# Patient Record
Sex: Male | Born: 1950 | Race: White | Hispanic: No | Smoking: Never smoker
Health system: Southern US, Community
[De-identification: ages and names within clinical notes are randomized; demographics above are authoritative.]

## PROBLEM LIST (undated history)

## (undated) DIAGNOSIS — R945 Abnormal results of liver function studies: Secondary | ICD-10-CM

## (undated) DIAGNOSIS — R0600 Dyspnea, unspecified: Secondary | ICD-10-CM

## (undated) DIAGNOSIS — R079 Chest pain, unspecified: Secondary | ICD-10-CM

## (undated) DIAGNOSIS — R0989 Other specified symptoms and signs involving the circulatory and respiratory systems: Secondary | ICD-10-CM

## (undated) DIAGNOSIS — R7989 Other specified abnormal findings of blood chemistry: Secondary | ICD-10-CM

## (undated) DIAGNOSIS — E119 Type 2 diabetes mellitus without complications: Secondary | ICD-10-CM

## (undated) DIAGNOSIS — I10 Essential (primary) hypertension: Secondary | ICD-10-CM

## (undated) DIAGNOSIS — E785 Hyperlipidemia, unspecified: Secondary | ICD-10-CM

## (undated) DIAGNOSIS — R0609 Other forms of dyspnea: Secondary | ICD-10-CM

## (undated) DIAGNOSIS — K219 Gastro-esophageal reflux disease without esophagitis: Secondary | ICD-10-CM

## (undated) HISTORY — DX: Other specified symptoms and signs involving the circulatory and respiratory systems: R09.89

## (undated) HISTORY — DX: Hyperlipidemia, unspecified: E78.5

## (undated) HISTORY — DX: Dyspnea, unspecified: R06.00

## (undated) HISTORY — DX: Chest pain, unspecified: R07.9

## (undated) HISTORY — DX: Other specified abnormal findings of blood chemistry: R79.89

## (undated) HISTORY — DX: Abnormal results of liver function studies: R94.5

## (undated) HISTORY — DX: Gastro-esophageal reflux disease without esophagitis: K21.9

## (undated) HISTORY — PX: REPLACEMENT TOTAL KNEE: SUR1224

## (undated) HISTORY — DX: Type 2 diabetes mellitus without complications: E11.9

## (undated) HISTORY — DX: Essential (primary) hypertension: I10

## (undated) HISTORY — DX: Other forms of dyspnea: R06.09

---

## 2013-08-19 ENCOUNTER — Encounter: Payer: Self-pay | Admitting: Cardiovascular Disease

## 2013-08-20 ENCOUNTER — Other Ambulatory Visit: Payer: Self-pay | Admitting: *Deleted

## 2013-08-20 ENCOUNTER — Encounter (HOSPITAL_COMMUNITY): Payer: Self-pay | Admitting: Pharmacy Technician

## 2013-08-20 ENCOUNTER — Encounter: Payer: Self-pay | Admitting: *Deleted

## 2013-08-20 DIAGNOSIS — Z01818 Encounter for other preprocedural examination: Secondary | ICD-10-CM

## 2013-08-21 ENCOUNTER — Encounter: Payer: Self-pay | Admitting: Cardiovascular Disease

## 2013-08-27 ENCOUNTER — Encounter (HOSPITAL_COMMUNITY): Payer: Self-pay | Admitting: *Deleted

## 2013-08-27 ENCOUNTER — Ambulatory Visit (HOSPITAL_COMMUNITY)
Admission: RE | Admit: 2013-08-27 | Discharge: 2013-08-27 | Disposition: A | Discharge status: Home / Self Care | Payer: Medicare Other | Source: Ambulatory Visit | Attending: Cardiovascular Disease | Admitting: Cardiovascular Disease

## 2013-08-27 ENCOUNTER — Encounter (HOSPITAL_COMMUNITY)
Admission: RE | Disposition: A | Discharge status: Home / Self Care | Payer: Self-pay | Source: Ambulatory Visit | Attending: Cardiovascular Disease

## 2013-08-27 DIAGNOSIS — R7989 Other specified abnormal findings of blood chemistry: Secondary | ICD-10-CM | POA: Insufficient documentation

## 2013-08-27 DIAGNOSIS — Z01818 Encounter for other preprocedural examination: Secondary | ICD-10-CM

## 2013-08-27 DIAGNOSIS — R0989 Other specified symptoms and signs involving the circulatory and respiratory systems: Secondary | ICD-10-CM | POA: Insufficient documentation

## 2013-08-27 DIAGNOSIS — I491 Atrial premature depolarization: Secondary | ICD-10-CM | POA: Insufficient documentation

## 2013-08-27 DIAGNOSIS — Z7982 Long term (current) use of aspirin: Secondary | ICD-10-CM | POA: Insufficient documentation

## 2013-08-27 DIAGNOSIS — E669 Obesity, unspecified: Secondary | ICD-10-CM | POA: Insufficient documentation

## 2013-08-27 DIAGNOSIS — R0609 Other forms of dyspnea: Secondary | ICD-10-CM | POA: Insufficient documentation

## 2013-08-27 DIAGNOSIS — E66811 Obesity, class 1: Secondary | ICD-10-CM

## 2013-08-27 DIAGNOSIS — R9439 Abnormal result of other cardiovascular function study: Secondary | ICD-10-CM

## 2013-08-27 DIAGNOSIS — E785 Hyperlipidemia, unspecified: Secondary | ICD-10-CM

## 2013-08-27 DIAGNOSIS — E119 Type 2 diabetes mellitus without complications: Secondary | ICD-10-CM

## 2013-08-27 DIAGNOSIS — K219 Gastro-esophageal reflux disease without esophagitis: Secondary | ICD-10-CM | POA: Insufficient documentation

## 2013-08-27 DIAGNOSIS — I10 Essential (primary) hypertension: Secondary | ICD-10-CM

## 2013-08-27 HISTORY — PX: LEFT HEART CATHETERIZATION WITH CORONARY ANGIOGRAM: SHX5451

## 2013-08-27 LAB — BASIC METABOLIC PANEL
BUN: 23 mg/dL (ref 6–23)
Chloride: 103 mEq/L (ref 96–112)
GFR calc Af Amer: >90 mL/min (ref >90)
GFR calc non Af Amer: >90 mL/min (ref >90)
Potassium: 3.4 mEq/L — ABNORMAL LOW (ref 3.5–5.1)
Sodium: 141 mEq/L (ref 135–145)

## 2013-08-27 LAB — CBC
HCT: 47 % (ref 39.0–52.0)
Hemoglobin: 16.6 g/dL (ref 13.0–17.0)
MCHC: 35.3 g/dL (ref 30.0–36.0)
MCV: 92.3 fL (ref 78.0–100.0)
RBC: 5.09 MIL/uL (ref 4.22–5.81)
WBC: 9.9 10*3/uL (ref 4.0–10.5)

## 2013-08-27 LAB — PROTIME-INR
INR: 1.07 (ref 0.00–1.49)
Prothrombin Time: 13.7 seconds (ref 11.6–15.2)

## 2013-08-27 LAB — GLUCOSE, CAPILLARY: Glucose-Capillary: 88 mg/dL (ref 70–99)

## 2013-08-27 SURGERY — LEFT HEART CATHETERIZATION WITH CORONARY ANGIOGRAM
Anesthesia: LOCAL

## 2013-08-27 MED ORDER — SODIUM CHLORIDE 0.9 % IV SOLN: INTRAVENOUS | Status: AC

## 2013-08-27 MED ORDER — ONDANSETRON HCL 4 MG/2ML IJ SOLN: 4.0000 mg | Freq: Four times a day (QID) | INTRAMUSCULAR | Status: DC | PRN

## 2013-08-27 MED ORDER — ACETAMINOPHEN 325 MG PO TABS: 650.0000 mg | ORAL_TABLET | ORAL | Status: DC | PRN

## 2013-08-27 MED ORDER — DIAZEPAM 5 MG PO TABS
ORAL_TABLET | ORAL | Status: AC
  Filled 2013-08-27: qty 1

## 2013-08-27 MED ORDER — SODIUM CHLORIDE 0.9 % IV SOLN
INTRAVENOUS | Status: DC
  Administered 2013-08-27: 75 mL/h via INTRAVENOUS

## 2013-08-27 MED ORDER — SODIUM CHLORIDE 0.9 % IJ SOLN: 3.0000 mL | INTRAMUSCULAR | Status: DC | PRN

## 2013-08-27 MED ORDER — LIDOCAINE HCL (PF) 1 % IJ SOLN
INTRAMUSCULAR | Status: AC
  Filled 2013-08-27: qty 30

## 2013-08-27 MED ORDER — DIAZEPAM 5 MG PO TABS
5.0000 mg | ORAL_TABLET | ORAL | Status: AC
  Administered 2013-08-27: 5 mg via ORAL

## 2013-08-27 MED ORDER — VERAPAMIL HCL 2.5 MG/ML IV SOLN
INTRAVENOUS | Status: AC
  Filled 2013-08-27: qty 2

## 2013-08-27 MED ORDER — POTASSIUM CHLORIDE CRYS ER 20 MEQ PO TBCR: 40.0000 meq | EXTENDED_RELEASE_TABLET | Freq: Once | ORAL | Status: DC

## 2013-08-27 MED ORDER — NITROGLYCERIN 0.2 MG/ML ON CALL CATH LAB
INTRAVENOUS | Status: AC
  Filled 2013-08-27: qty 1

## 2013-08-27 MED ORDER — HEPARIN (PORCINE) IN NACL 2-0.9 UNIT/ML-% IJ SOLN
INTRAMUSCULAR | Status: AC
  Filled 2013-08-27: qty 1500

## 2013-08-27 MED ORDER — ASPIRIN 81 MG PO CHEW
CHEWABLE_TABLET | ORAL | Status: AC
  Administered 2013-08-27: 81 mg
  Filled 2013-08-27: qty 1

## 2013-08-27 MED ORDER — ASPIRIN 81 MG PO CHEW: 81.0000 mg | CHEWABLE_TABLET | ORAL | Status: DC

## 2013-08-27 MED ORDER — HEPARIN SODIUM (PORCINE) 1000 UNIT/ML IJ SOLN
INTRAMUSCULAR | Status: AC
  Filled 2013-08-27: qty 1

## 2013-08-27 NOTE — Progress Notes (Signed)
Discharge instruction given per Md order.  Pt  was able to verbalize understanding.   Pt to car via wheelchair.  Pt denies any discomfort at this time.

## 2013-08-27 NOTE — Interval H&P Note (Signed)
Cath Lab Visit (complete for each Cath Lab visit)  Clinical Evaluation Leading to the Procedure:   ACS: no  Non-ACS:    Anginal Classification: No Symptoms  Anti-ischemic medical therapy: No Therapy  Non-Invasive Test Results: Intermediate-risk stress test findings: cardiac mortality 1-3%/year  Prior CABG: No previous CABG      History and Physical Interval Note:  08/27/2013 2:45 PM  Shawn Vaughan  has presented today for surgery, with the diagnosis of ANGINA  The various methods of treatment have been discussed with the patient and family. After consideration of risks, benefits and other options for treatment, the patient has consented to  Procedure(s): LEFT HEART CATHETERIZATION WITH CORONARY ANGIOGRAM (N/A) as a surgical intervention .  The patient's history has been reviewed, patient examined, no change in status, stable for surgery.  I have reviewed the patient's chart and labs.  Questions were answered to the patient's satisfaction.     [name]

## 2013-08-27 NOTE — H&P (Signed)
Chief Complaint: Abnormal NST  HPI: The patient is a 62 y/o male with HTN, HLD, T2DM and obesity, who was referred by Shawn Vaughan, of [facility], for a diagnostic LHC, in the setting of an abnormal NST. He denies any recent history of chest pain, pressure or tightness. He also denies any SOB, orthopnea, PND or LEE. He states that the stress test and echocardiogram were performed after a routine EKG came back abnormal. His NST, performed 08/06/13, was abnormal with moderate inferior ischemia with a low EF of 39%. His 2D echo, however, demonstrated normal systolic function with an EF of 65-70%.   Today, the patient continues to deny any symptoms. He also denies any recent fever, chills, n/v, melena, hematochezia. No planned elective surgeries in the past year. He reports daily compliance with his prescribed medications, including ASA, Lisinopril, Metformin, Glipizide and Omeprazole. He is not on any medications for his cholesterol.    Past Medical History  Diagnosis Date  . Esophageal reflux   . Diabetes   . Hyperlipidemia   . HTN (hypertension)   . Elevated LFTs   . Chest pain     stress test 08/06/13- EF 39%, moderate inferior and lateral ischemia  . Carotid bruit     dopplers 07/31/13-no hemidynamically significant stenosis bilaterally  . DOE (dyspnea on exertion)     echo 08/06/13-EF 65-70%, normal study    Past Surgical History  Procedure Laterality Date  . Replacement total knee      bilateral    Family History  Problem Relation Age of Onset  . Hyperlipidemia Mother    Social History:  reports that he has never smoked. He does not have any smokeless tobacco history on file. He reports that he does not drink alcohol. His drug history is not on file.  Allergies: No Known Allergies  Medications Prior to Admission  Medication Sig Dispense Refill  . aspirin 325 MG tablet Take 325 mg by mouth daily.      Marland Kitchen glipiZIDE (GLUCOTROL) 5 MG tablet Take 5 mg by mouth daily before  breakfast.      . lisinopril (PRINIVIL,ZESTRIL) 10 MG tablet Take 10 mg by mouth daily.      . metFORMIN (GLUCOPHAGE) 850 MG tablet Take 850 mg by mouth 2 (two) times daily with a meal.      . metoCLOPramide (REGLAN) 10 MG tablet Take 10 mg by mouth 2 (two) times daily.      Marland Kitchen omeprazole (PRILOSEC) 40 MG capsule Take 40 mg by mouth daily.        Results for orders placed during the hospital encounter of 08/27/13 (from the past 48 hour(s))  CBC     Status: None   Collection Time    08/27/13 11:41 AM      Result Value Range   WBC 9.9  4.0 - 10.5 K/uL   RBC 5.09  4.22 - 5.81 MIL/uL   Hemoglobin 16.6  13.0 - 17.0 g/dL   HCT 16.1  09.6 - 04.5 %   MCV 92.3  78.0 - 100.0 fL   MCH 32.6  26.0 - 34.0 pg   MCHC 35.3  30.0 - 36.0 g/dL   RDW 40.9  81.1 - 91.4 %   Platelets 269  150 - 400 K/uL   No results found.  Review of Systems  Respiratory: Negative for shortness of breath.   Cardiovascular: Negative for chest pain, orthopnea, leg swelling and PND.  Gastrointestinal: Negative for nausea, vomiting, abdominal pain, blood  in stool and melena.  All other systems reviewed and are negative.    Blood pressure 169/83, pulse 83, temperature 98.2 F (36.8 C), temperature source Oral, height 6' (1.829 m), weight 237 lb (107.502 kg), SpO2 99.00%. Physical Exam  Constitutional: He is oriented to person, place, and time. He appears well-developed and well-nourished. No distress.  Neck: No JVD present. Carotid bruit is not present.  Cardiovascular: Normal rate, regular rhythm, normal heart sounds and intact distal pulses.  Exam reveals no gallop and no friction rub.   No murmur heard. Pulses:      Radial pulses are 2+ on the right side, and 2+ on the left side.       Dorsalis pedis pulses are 2+ on the right side, and 2+ on the left side.  Respiratory: Effort normal and breath sounds normal. No respiratory distress. He has no wheezes. He has no rales.  GI: Soft. Bowel sounds are normal. He  exhibits no distension and no mass. There is no tenderness.  Obese   Musculoskeletal: He exhibits no edema.  Neurological: He is alert and oriented to person, place, and time.  Skin: Skin is warm and dry. He is not diaphoretic.  Psychiatric: He has a normal mood and affect. His behavior is normal.    Body mass index is 32.14 kg/(m^2).  Assessment/Plan Principal Problem:   Abnormal nuclear stress test 12-2/14 Active Problems:   HTN (hypertension)   HLD (hyperlipidemia)   Type 2 diabetes mellitus   Obesity (BMI 30.0-34.9)  Plan: Exam benign w/ exception of Xanthelasmas above right eye. He is not on any medication for mentioned HLD. He will require a Lipid panel if not done recently by Shawn Vaughan. ? Statin therapy. EKG w/o any acute changes. Moderately hypertensive w/ SBP in the upper 160s. Cath lab may give PRN IV Hydralazine. CBC normal. BMP and INR pending. Plan for diagnostic LHC +/- PCI with Dr. Allyson Vaughan.   Shawn Butcher, PA-C 08/27/2013, 12:07 PM   Agree with note written by Shawn Vaughan  PAC  + MPI in setting of + CRF. Asymptomatic. Referred for cath.    Shawn Vaughan 08/27/2013 2:44 PM

## 2013-08-27 NOTE — Progress Notes (Signed)
Received pt from cath procedure alert and able to tolerate fluids. 

## 2013-08-27 NOTE — CV Procedure (Signed)
Quint Chestnut is a 62 y.o. male    [id number] LOCATION:  FACILITY: [facility]  PHYSICIAN: Nanetta Batty, M.D. 11-08-1950   DATE OF PROCEDURE:  08/27/2013  DATE OF DISCHARGE:     CARDIAC CATHETERIZATION     History obtained from chart review.The patient is a 62 y/o male with HTN, HLD, T2DM and obesity, who was referred by Dr. Feliberto Gottron, of [facility], for a diagnostic LHC, in the setting of an abnormal NST. He denies any recent history of chest pain, pressure or tightness. He also denies any SOB, orthopnea, PND or LEE. He states that the stress test and echocardiogram were performed after a routine EKG came back abnormal. His NST, performed 08/06/13, was abnormal with moderate inferior ischemia with a low EF of 39%. His 2D echo, however, demonstrated normal systolic function with an EF of 65-70%.  Today, the patient continues to deny any symptoms. He also denies any recent fever, chills, n/v, melena, hematochezia. No planned elective surgeries in the past year. He reports daily compliance with his prescribed medications, including ASA, Lisinopril, Metformin, Glipizide and Omeprazole. He is not on any medications for his cholesterol.     PROCEDURE DESCRIPTION:   The patient was brought to the second floor [facility] cath lab in the postabsorptive state. He was premedicated with Valium 5 mg by mouth. His right wrist was prepped and shaved in usual sterile fashion. Xylocaine 1% was used for local anesthesia. A 5 French sheath was inserted into the right radial artery using standard Seldinger technique. The patient received 5000 units  of heparin  intravenously.  A 5 French 3 times a day catheter and pigtail catheters were used for selective coronary angiography and left ventriculography respectively. Visipaque dye was used for the entirety of the case. Retrograde aortic, left ventricular pullback pressures were recorded.    HEMODYNAMICS:    There is no pullback gradient  noted  ANGIOGRAPHIC RESULTS:   1. Left main; normal  2. LAD; normal 3. Left circumflex; nondominant normal. There was a moderate size ramus branch that was normal as well.  4. Right coronary artery; dominant and normal 5. Left ventriculography; RAO left ventriculogram was performed using  25 mL of Visipaque dye at 12 mL/second. The overall LVEF estimated  60 %  Without wall motion abnormalities  IMPRESSION:Mr. Schipani has essentially normal coronary arteries and normal left ventricular function. I believe his Myoview false-positive. The sheath was removed and a TR band was placed on the right wrist chief patent hemostasis. The patient left the Cath Lab in stable condition. He'll be hydrated for 2 hours, discharged home and followup with Dr. Hanley Hays as an outpatient.  [name] MD, [facility] 08/27/2013 3:31 PM

## 2014-08-14 ENCOUNTER — Encounter (HOSPITAL_COMMUNITY): Payer: Self-pay | Admitting: Cardiovascular Disease

## 2016-02-10 ENCOUNTER — Encounter: Payer: Self-pay | Admitting: Cardiovascular Disease

## 2016-02-10 ENCOUNTER — Other Ambulatory Visit: Payer: Self-pay | Admitting: *Deleted

## 2016-02-10 ENCOUNTER — Ambulatory Visit (INDEPENDENT_AMBULATORY_CARE_PROVIDER_SITE_OTHER): Payer: Medicare Other | Admitting: Cardiovascular Disease

## 2016-02-10 ENCOUNTER — Ambulatory Visit: Payer: Medicare Other | Admitting: Cardiovascular Disease

## 2016-02-10 VITALS — BP 114/74 | HR 89 | Ht 72.0 in | Wt 241.0 lb

## 2016-02-10 DIAGNOSIS — R9439 Abnormal result of other cardiovascular function study: Secondary | ICD-10-CM

## 2016-02-10 DIAGNOSIS — Z7901 Long term (current) use of anticoagulants: Secondary | ICD-10-CM

## 2016-02-10 DIAGNOSIS — Z01818 Encounter for other preprocedural examination: Secondary | ICD-10-CM

## 2016-02-10 DIAGNOSIS — R931 Abnormal findings on diagnostic imaging of heart and coronary circulation: Secondary | ICD-10-CM

## 2016-02-10 DIAGNOSIS — I10 Essential (primary) hypertension: Secondary | ICD-10-CM | POA: Diagnosis not present

## 2016-02-10 DIAGNOSIS — Z79899 Other long term (current) drug therapy: Secondary | ICD-10-CM

## 2016-02-10 DIAGNOSIS — I519 Heart disease, unspecified: Secondary | ICD-10-CM

## 2016-02-10 NOTE — Assessment & Plan Note (Signed)
The patient had a cardiac catheterization performed by myself 08/27/13. The right radial approach revealing essentially normal coronary arteries and normal LV function. This was done after a false-positive Myoview. He was [facility] who did an echo that apparently showed severe LV dysfunction with an abnormal Myoview as well and was referred here for repeat cardiac catheterization. He denies chest pain or shortness of breath.  I have reviewed the risks, indications, and alternatives to cardiac catheterization, possible angioplasty, and stenting with the patient. Risks include but are not limited to bleeding, infection, vascular injury, stroke, myocardial infection, arrhythmia, kidney injury, radiation-related injury in the case of prolonged fluoroscopy use, emergency cardiac surgery, and death. The patient understands the risks of serious complication is 1-2 in 1000 with diagnostic cardiac cath and 1-2% or less with angioplasty/stenting.

## 2016-02-10 NOTE — Assessment & Plan Note (Signed)
History of hypertension blood pressure measured at 114/74. He is on lisinopril. Continue current meds at current dosing

## 2016-02-10 NOTE — Progress Notes (Addendum)
02/10/2016 Shawn Vaughan   Jan 22, 1951  [id number]  Primary Physician Roxanne Minsuran, Michael, PA-C Primary Cardiologist: Runell GessJonathan J.  MD Roseanne RenoFACP,FACC,FAHA, FSCAI   HPI:  The patient is a 65y/o male with HTN, HLD, T2DM and obesity, who was referred by [facility] Feliberto [facility]arrio, [facility], for a diagnostic LHC, in the setting of an abnormal NST. i last saw him at the time of cath 08/27/13.He denies any recent history of chest pain, pressure or tightness. He also denies any SOB, orthopnea, PND or LEE. He states that the stress test and echocardiogram were performed after a routine EKG came back abnormal. His NST, performed 08/06/13, was abnormal with moderate inferior ischemia with a low EF of 39%. His 2D echo, however, demonstrated normal systolic function with an EF of 65-70%.  Today, the patient continues to deny any symptoms. He also denies any recent fever, chills, n/v, melena, hematochezia. No planned elective surgeries in the past year. He reports daily compliance with his prescribed medications, including ASA, Lisinopril, Metformin, Glipizide and Omeprazole. He apparently [facility] recently who did an echo that revealed severe LV dysfunction with an ejection fraction of 25-30% on 01/04/16 and a stress test on 02/02/16 that showed mild to moderate scar as well as ischemia.t that showed abnormality as well. He really denies chest pain or shortness of breath but was referred back for repeat cardiac catheterization.   Current Outpatient Prescriptions  Medication Sig Dispense Refill  . aspirin 325 MG tablet Take 325 mg by mouth daily.    Marland Kitchen. glipiZIDE (GLUCOTROL) 5 MG tablet Take 5 mg by mouth daily before breakfast.    . lisinopril (PRINIVIL,ZESTRIL) 10 MG tablet Take 10 mg by mouth daily.    . metFORMIN (GLUCOPHAGE) 850 MG tablet Take 850 mg by mouth 2 (two) times daily with a meal.    . metoCLOPramide (REGLAN) 10 MG tablet Take 10 mg by mouth 2 (two) times daily.    Marland Kitchen. omeprazole (PRILOSEC) 40 MG  capsule Take 40 mg by mouth daily.     No current facility-administered medications for this visit.    No Known Allergies  Social History   Social History  . Marital Status: Divorced    Spouse Name: N/A  . Number of Children: N/A  . Years of Education: N/A   Occupational History  . Not on file.   Social History Main Topics  . Smoking status: Never Smoker   . Smokeless tobacco: Not on file  . Alcohol Use: No  . Drug Use: Not on file  . Sexual Activity: Not on file   Other Topics Concern  . Not on file   Social History Narrative     Review of Systems: General: negative for chills, fever, night sweats or weight changes.  Cardiovascular: negative for chest pain, dyspnea on exertion, edema, orthopnea, palpitations, paroxysmal nocturnal dyspnea or shortness of breath Dermatological: negative for rash Respiratory: negative for cough or wheezing Urologic: negative for hematuria Abdominal: negative for nausea, vomiting, diarrhea, bright red blood per rectum, melena, or hematemesis Neurologic: negative for visual changes, syncope, or dizziness All other systems reviewed and are otherwise negative except as noted above.    Blood pressure 114/74, pulse 89, height 6' (1.829 m), weight 241 lb (109.317 kg).  General appearance: alert and no distress Neck: no adenopathy, no carotid bruit, no JVD, supple, symmetrical, trachea midline and thyroid not enlarged, symmetric, no tenderness/mass/nodules Lungs: clear to auscultation bilaterally Heart: regular rate and rhythm, S1, S2 normal, no  murmur, click, rub or gallop Extremities: extremities normal, atraumatic, no cyanosis or edema  EKG not performed today  ASSESSMENT AND PLAN:   HTN (hypertension) History of hypertension blood pressure measured at 114/74. He is on lisinopril. Continue current meds at current dosing  Abnormal nuclear stress test 12-2/14 The patient had a cardiac catheterization performed by myself 08/27/13. The  right radial approach revealing essentially normal coronary arteries and normal LV function. This was done after a false-positive Myoview. He was seen recently by Burlene Arnt [facility] who did an echo that apparently showed severe LV dysfunction with an abnormal Myoview as well and was referred here for repeat cardiac catheterization. He denies chest pain or shortness of breath.  I have reviewed the risks, indications, and alternatives to cardiac catheterization, possible angioplasty, and stenting with the patient. Risks include but are not limited to bleeding, infection, vascular injury, stroke, myocardial infection, arrhythmia, kidney injury, radiation-related injury in the case of prolonged fluoroscopy use, emergency cardiac surgery, and death. The patient understands the risks of serious complication is 1-2 in 1000 with diagnostic cardiac cath and 1-2% or less with angioplasty/stenting.       [name] MD FACP,FACC,FAHA, [facility] 02/10/2016 11:51 AM

## 2016-02-10 NOTE — Patient Instructions (Signed)
Medication Instructions:  .ISNTCUR   Testing/Procedures: Your physician has requested that you have a cardiac catheterization. Cardiac catheterization is used to diagnose and/or treat various heart conditions. Doctors may recommend this procedure for a number of different reasons. The most common reason is to evaluate chest pain. Chest pain can be a symptom of coronary artery disease (CAD), and cardiac catheterization can show whether plaque is narrowing or blocking your heart's arteries. This procedure is also used to evaluate the valves, as well as measure the blood flow and oxygen levels in different parts of your heart. For further information please visit [email or web address].   Following your catheterization, you will not be allowed to drive for 3 days.  No lifting, pushing, or pulling greater that 10 pounds is allowed for 1 week.  You will be required to have the following tests prior to the procedure:  1. Blood work-the blood work can be done no more than 7 days prior to the procedure.  It can be done at any [facility] lab.  There is one downstairs on the first floor of this building and one in the [facility] building [phone]([address], [address]).  2. Chest Xray-the [facility].      Puncture site RIGHT RADIAL   Any Other Special Instructions Will Be Listed Below (If Applicable).     If you need a refill on your cardiac medications before your next appointment, please call your pharmacy.

## 2016-02-11 ENCOUNTER — Encounter: Payer: Self-pay | Admitting: *Deleted

## 2016-02-11 LAB — BASIC METABOLIC PANEL
BUN: 15 mg/dL (ref 7–25)
CHLORIDE: 107 mmol/L (ref 98–110)
CO2: 20 mmol/L (ref 20–31)
Calcium: 9.1 mg/dL (ref 8.6–10.3)
Creat: 0.77 mg/dL (ref 0.70–1.25)
GLUCOSE: 192 mg/dL — AB (ref 65–99)
POTASSIUM: 4.3 mmol/L (ref 3.5–5.3)
Sodium: 139 mmol/L (ref 135–146)

## 2016-02-11 LAB — PROTIME-INR
INR: 0.99 (ref <1.50)
PROTHROMBIN TIME: 13.2 s (ref 11.6–15.2)

## 2016-02-11 LAB — CBC WITH DIFFERENTIAL/PLATELET
BASOS PCT: 1 %
Basophils Absolute: 86 cells/uL (ref 0–200)
EOS PCT: 2 %
Eosinophils Absolute: 172 cells/uL (ref 15–500)
HCT: 48.6 % (ref 38.5–50.0)
HEMOGLOBIN: 16.6 g/dL (ref 13.2–17.1)
LYMPHS ABS: 2408 {cells}/uL (ref 850–3900)
Lymphocytes Relative: 28 %
MCH: 31.4 pg (ref 27.0–33.0)
MCHC: 34.2 g/dL (ref 32.0–36.0)
MCV: 91.9 fL (ref 80.0–100.0)
MPV: 10.2 fL (ref 7.5–12.5)
Monocytes Absolute: 602 cells/uL (ref 200–950)
Monocytes Relative: 7 %
NEUTROS ABS: 5332 {cells}/uL (ref 1500–7800)
NEUTROS PCT: 62 %
Platelets: 292 10*3/uL (ref 140–400)
RBC: 5.29 MIL/uL (ref 4.20–5.80)
RDW: 14.6 % (ref 11.0–15.0)
WBC: 8.6 10*3/uL (ref 3.8–10.8)

## 2016-02-11 LAB — TSH: TSH: 1.03 m[IU]/L (ref 0.40–4.50)

## 2016-02-11 LAB — APTT: aPTT: 33 seconds (ref 24–37)

## 2016-02-11 NOTE — Telephone Encounter (Signed)
This encounter was created in error - please disregard.

## 2016-02-12 ENCOUNTER — Ambulatory Visit
Admission: RE | Admit: 2016-02-12 | Discharge: 2016-02-12 | Disposition: A | Discharge status: Home / Self Care | Payer: Medicare Other | Source: Ambulatory Visit | Attending: Cardiovascular Disease | Admitting: Cardiovascular Disease

## 2016-02-12 DIAGNOSIS — I10 Essential (primary) hypertension: Secondary | ICD-10-CM

## 2016-02-12 DIAGNOSIS — R9439 Abnormal result of other cardiovascular function study: Secondary | ICD-10-CM

## 2016-02-12 DIAGNOSIS — Z79899 Other long term (current) drug therapy: Secondary | ICD-10-CM

## 2016-02-12 DIAGNOSIS — Z01818 Encounter for other preprocedural examination: Secondary | ICD-10-CM

## 2016-02-16 ENCOUNTER — Encounter: Payer: Self-pay | Admitting: Cardiovascular Disease

## 2016-02-19 ENCOUNTER — Ambulatory Visit: Payer: Medicare Other | Admitting: Cardiovascular Disease

## 2016-02-22 ENCOUNTER — Other Ambulatory Visit: Payer: Self-pay

## 2016-02-22 ENCOUNTER — Encounter (HOSPITAL_COMMUNITY)
Admission: RE | Disposition: A | Discharge status: Home / Self Care | Payer: Self-pay | Source: Ambulatory Visit | Attending: Cardiovascular Disease

## 2016-02-22 ENCOUNTER — Ambulatory Visit (HOSPITAL_COMMUNITY)
Admission: RE | Admit: 2016-02-22 | Discharge: 2016-02-22 | Disposition: A | Discharge status: Home / Self Care | Payer: Medicare Other | Source: Ambulatory Visit | Attending: Cardiovascular Disease | Admitting: Cardiovascular Disease

## 2016-02-22 DIAGNOSIS — I519 Heart disease, unspecified: Secondary | ICD-10-CM | POA: Insufficient documentation

## 2016-02-22 DIAGNOSIS — I10 Essential (primary) hypertension: Secondary | ICD-10-CM | POA: Diagnosis not present

## 2016-02-22 DIAGNOSIS — E669 Obesity, unspecified: Secondary | ICD-10-CM | POA: Insufficient documentation

## 2016-02-22 DIAGNOSIS — Z7984 Long term (current) use of oral hypoglycemic drugs: Secondary | ICD-10-CM | POA: Insufficient documentation

## 2016-02-22 DIAGNOSIS — E785 Hyperlipidemia, unspecified: Secondary | ICD-10-CM | POA: Insufficient documentation

## 2016-02-22 DIAGNOSIS — Z6832 Body mass index (BMI) 32.0-32.9, adult: Secondary | ICD-10-CM | POA: Insufficient documentation

## 2016-02-22 DIAGNOSIS — R931 Abnormal findings on diagnostic imaging of heart and coronary circulation: Secondary | ICD-10-CM | POA: Diagnosis not present

## 2016-02-22 DIAGNOSIS — Z7982 Long term (current) use of aspirin: Secondary | ICD-10-CM | POA: Diagnosis not present

## 2016-02-22 DIAGNOSIS — E119 Type 2 diabetes mellitus without complications: Secondary | ICD-10-CM | POA: Insufficient documentation

## 2016-02-22 DIAGNOSIS — Z79899 Other long term (current) drug therapy: Secondary | ICD-10-CM | POA: Insufficient documentation

## 2016-02-22 DIAGNOSIS — R9439 Abnormal result of other cardiovascular function study: Secondary | ICD-10-CM | POA: Diagnosis present

## 2016-02-22 HISTORY — PX: CARDIAC CATHETERIZATION: SHX172

## 2016-02-22 LAB — GLUCOSE, CAPILLARY: Glucose-Capillary: 179 mg/dL — ABNORMAL HIGH (ref 65–99)

## 2016-02-22 SURGERY — LEFT HEART CATH AND CORONARY ANGIOGRAPHY
Anesthesia: LOCAL

## 2016-02-22 MED ORDER — LIDOCAINE HCL (PF) 1 % IJ SOLN
INTRAMUSCULAR | Status: DC | PRN
  Administered 2016-02-22: 5 mL

## 2016-02-22 MED ORDER — LIDOCAINE HCL (PF) 1 % IJ SOLN
INTRAMUSCULAR | Status: AC
  Filled 2016-02-22: qty 30

## 2016-02-22 MED ORDER — IOPAMIDOL (ISOVUE-370) INJECTION 76%
INTRAVENOUS | Status: DC | PRN
  Administered 2016-02-22: 85 mL via INTRA_ARTERIAL

## 2016-02-22 MED ORDER — HEPARIN SODIUM (PORCINE) 1000 UNIT/ML IJ SOLN
INTRAMUSCULAR | Status: AC
  Filled 2016-02-22: qty 1

## 2016-02-22 MED ORDER — HEPARIN SODIUM (PORCINE) 1000 UNIT/ML IJ SOLN
INTRAMUSCULAR | Status: DC | PRN
  Administered 2016-02-22: 5000 [IU] via INTRAVENOUS

## 2016-02-22 MED ORDER — SODIUM CHLORIDE 0.9% FLUSH: 3.0000 mL | INTRAVENOUS | Status: DC | PRN

## 2016-02-22 MED ORDER — NITROGLYCERIN 1 MG/10 ML FOR IR/CATH LAB
INTRA_ARTERIAL | Status: AC
  Filled 2016-02-22: qty 10

## 2016-02-22 MED ORDER — SODIUM CHLORIDE 0.9 % WEIGHT BASED INFUSION
3.0000 mL/kg/h | INTRAVENOUS | Status: DC
  Administered 2016-02-22: 3 mL/kg/h via INTRAVENOUS

## 2016-02-22 MED ORDER — VERAPAMIL HCL 2.5 MG/ML IV SOLN
INTRA_ARTERIAL | Status: DC | PRN
  Administered 2016-02-22: 5 mL via INTRA_ARTERIAL

## 2016-02-22 MED ORDER — ASPIRIN 81 MG PO CHEW
81.0000 mg | CHEWABLE_TABLET | ORAL | Status: AC
  Administered 2016-02-22: 81 mg via ORAL

## 2016-02-22 MED ORDER — SODIUM CHLORIDE 0.9 % WEIGHT BASED INFUSION: 1.0000 mL/kg/h | INTRAVENOUS | Status: DC

## 2016-02-22 MED ORDER — ASPIRIN 81 MG PO CHEW
CHEWABLE_TABLET | ORAL | Status: AC
  Administered 2016-02-22: 81 mg via ORAL
  Filled 2016-02-22: qty 1

## 2016-02-22 MED ORDER — HEPARIN (PORCINE) IN NACL 2-0.9 UNIT/ML-% IJ SOLN
INTRAMUSCULAR | Status: AC
  Filled 2016-02-22: qty 1000

## 2016-02-22 MED ORDER — VERAPAMIL HCL 2.5 MG/ML IV SOLN
INTRAVENOUS | Status: AC
  Filled 2016-02-22: qty 2

## 2016-02-22 SURGICAL SUPPLY — 13 items
CATH INFINITI 5 FR JL3.5 (CATHETERS) ×2 IMPLANT
CATH INFINITI 5FR ANG PIGTAIL (CATHETERS) ×2 IMPLANT
CATH INFINITI JR4 5F (CATHETERS) ×2 IMPLANT
CATH OPTITORQUE TIG 4.0 5F (CATHETERS) ×2 IMPLANT
DEVICE RAD COMP TR BAND LRG (VASCULAR PRODUCTS) ×4 IMPLANT
GLIDESHEATH SLEND A-KIT 6F 22G (SHEATH) ×2 IMPLANT
KIT HEART LEFT (KITS) ×2 IMPLANT
PACK CARDIAC CATHETERIZATION (CUSTOM PROCEDURE TRAY) ×2 IMPLANT
SYR MEDRAD MARK V 150ML (SYRINGE) ×2 IMPLANT
TRANSDUCER W/STOPCOCK (MISCELLANEOUS) ×2 IMPLANT
TUBING CIL FLEX 10 FLL-RA (TUBING) ×2 IMPLANT
WIRE HI TORQ VERSACORE-J 145CM (WIRE) ×2 IMPLANT
WIRE SAFE-T 1.5MM-J .035X260CM (WIRE) ×2 IMPLANT

## 2016-02-22 NOTE — Discharge Instructions (Signed)
° ° °  DO NOT RESTART METFORMIN UNTIL 02/25/2016  Radial Site Care Refer to this sheet in the next few weeks. These instructions provide you with information about caring for yourself after your procedure. Your health care provider may also give you more specific instructions. Your treatment has been planned according to current medical practices, but problems sometimes occur. Call your health care provider if you have any problems or questions after your procedure. WHAT TO EXPECT AFTER THE PROCEDURE After your procedure, it is typical to have the following:  Bruising at the radial site that usually fades within 1-2 weeks.  Blood collecting in the tissue (hematoma) that may be painful to the touch. It should usually decrease in size and tenderness within 1-2 weeks. HOME CARE INSTRUCTIONS  Take medicines only as directed by your health care provider.  You may shower 24-48 hours after the procedure or as directed by your health care provider. Remove the bandage (dressing) and gently wash the site with plain soap and water. Pat the area dry with a clean towel. Do not rub the site, because this may cause bleeding.  Do not take baths, swim, or use a hot tub until your health care provider approves.  Check your insertion site every day for redness, swelling, or drainage.  Do not apply powder or lotion to the site.  Do not flex or bend the affected arm for 24 hours or as directed by your health care provider.  Do not push or pull heavy objects with the affected arm for 24 hours or as directed by your health care provider.  Do not lift over 10 lb (4.5 kg) for 5 days after your procedure or as directed by your health care provider.  Ask your health care provider when it is okay to:  Return to work or school.  Resume usual physical activities or sports.  Resume sexual activity.  Do not drive home if you are discharged the same day as the procedure. Have someone else drive you.  You may  drive 24 hours after the procedure unless otherwise instructed by your health care provider.  Do not operate machinery or power tools for 24 hours after the procedure.  If your procedure was done as an outpatient procedure, which means that you went home the same day as your procedure, a responsible adult should be with you for the first 24 hours after you arrive home.  Keep all follow-up visits as directed by your health care provider. This is important. SEEK MEDICAL CARE IF:  You have a fever.  You have chills.  You have increased bleeding from the radial site. Hold pressure on the site. SEEK IMMEDIATE MEDICAL CARE IF:  You have unusual pain at the radial site.  You have redness, warmth, or swelling at the radial site.  You have drainage (other than a small amount of blood on the dressing) from the radial site.  The radial site is bleeding, and the bleeding does not stop after 30 minutes of holding steady pressure on the site.  Your arm or hand becomes pale, cool, tingly, or numb.   This information is not intended to replace advice given to you by your health care provider. Make sure you discuss any questions you have with your health care provider.   Document Released: 09/24/2010 Document Revised: 09/12/2014 Document Reviewed: 03/10/2014 Elsevier Interactive Patient Education [employer]2016 Elsevier Inc.

## 2016-02-22 NOTE — Interval H&P Note (Signed)
Cath Lab Visit (complete for each Cath Lab visit)  Clinical Evaluation Leading to the Procedure:   ACS: No.  Non-ACS:    Anginal Classification: CCS II  Anti-ischemic medical therapy: Minimal Therapy (1 class of medications)  Non-Invasive Test Results: Intermediate-risk stress test findings: cardiac mortality 1-3%/year  Prior CABG: No previous CABG      History and Physical Interval Note:  02/22/2016 10:53 AM  Shella MaximPaul Gignac  has presented today for surgery, with the diagnosis of Abnormal Stress Test  The various methods of treatment have been discussed with the patient and family. After consideration of risks, benefits and other options for treatment, the patient has consented to  Procedure(s): Left Heart Cath and Coronary Angiography (N/A) as a surgical intervention .  The patient's history has been reviewed, patient examined, no change in status, stable for surgery.  I have reviewed the patient's chart and labs.  Questions were answered to the patient's satisfaction.     Nanetta BattyBerry, 

## 2016-02-22 NOTE — H&P (View-Only) (Signed)
02/10/2016 Shawn Vaughan   Jan 22, 1951  [id number]  Primary Physician Shawn Vaughan, Michael, PA-C Primary Cardiologist: Shawn Vaughan.  MD Shawn Vaughan,FACC,Shawn Vaughan, Shawn Vaughan   HPI:  The patient is a 65y/o male with HTN, HLD, T2DM and obesity, who was [facility] for a diagnostic LHC, in the setting of an abnormal NST. i last saw him at the time of cath 08/27/13.He denies any recent history of chest pain, pressure or tightness. He also denies any SOB, orthopnea, PND or LEE. He states that the stress test and echocardiogram were performed after a routine EKG came back abnormal. His NST, performed 08/06/13, was abnormal with moderate inferior ischemia with a low EF of 39%. His 2D echo, however, demonstrated normal systolic function with an EF of 65-70%.  Today, the patient continues to deny any symptoms. He also denies any recent fever, chills, n/v, melena, hematochezia. No planned elective surgeries in the past year. He reports daily compliance with his prescribed medications, including ASA, Lisinopril, Metformin, Glipizide and Omeprazole. He apparently saw Shawn Vaughan recently who did an echo that revealed severe LV dysfunction with an ejection fraction of 25-30% on 01/04/16 and a stress test on 02/02/16 that showed mild to moderate scar as well as ischemia.t that showed abnormality as well. He really denies chest pain or shortness of breath but was referred back for repeat cardiac catheterization.   Current Outpatient Prescriptions  Medication Sig Dispense Refill  . aspirin 325 MG tablet Take 325 mg by mouth daily.    Marland Kitchen. glipiZIDE (GLUCOTROL) 5 MG tablet Take 5 mg by mouth daily before breakfast.    . lisinopril (PRINIVIL,ZESTRIL) 10 MG tablet Take 10 mg by mouth daily.    . metFORMIN (GLUCOPHAGE) 850 MG tablet Take 850 mg by mouth 2 (two) times daily with a meal.    . metoCLOPramide (REGLAN) 10 MG tablet Take 10 mg by mouth 2 (two) times daily.    Marland Kitchen. omeprazole (PRILOSEC) 40 MG  capsule Take 40 mg by mouth daily.     No current facility-administered medications for this visit.    No Known Allergies  Social History   Social History  . Marital Status: Divorced    Spouse Name: N/A  . Number of Children: N/A  . Years of Education: N/A   Occupational History  . Not on file.   Social History Main Topics  . Smoking status: Never Smoker   . Smokeless tobacco: Not on file  . Alcohol Use: No  . Drug Use: Not on file  . Sexual Activity: Not on file   Other Topics Concern  . Not on file   Social History Narrative     Review of Systems: General: negative for chills, fever, night sweats or weight changes.  Cardiovascular: negative for chest pain, dyspnea on exertion, edema, orthopnea, palpitations, paroxysmal nocturnal dyspnea or shortness of breath Dermatological: negative for rash Respiratory: negative for cough or wheezing Urologic: negative for hematuria Abdominal: negative for nausea, vomiting, diarrhea, bright red blood per rectum, melena, or hematemesis Neurologic: negative for visual changes, syncope, or dizziness All other systems reviewed and are otherwise negative except as noted above.    Blood pressure 114/74, pulse 89, height 6' (1.829 m), weight 241 lb (109.317 kg).  General appearance: alert and no distress Neck: no adenopathy, no carotid bruit, no JVD, supple, symmetrical, trachea midline and thyroid not enlarged, symmetric, no tenderness/mass/nodules Lungs: clear to auscultation bilaterally Heart: regular rate and rhythm, S1, S2 normal, no  murmur, click, rub or gallop Extremities: extremities normal, atraumatic, no cyanosis or edema  EKG not performed today  ASSESSMENT AND PLAN:   HTN (hypertension) History of hypertension blood pressure measured at 114/74. He is on lisinopril. Continue current meds at current dosing  Abnormal nuclear stress test 12-2/14 The patient had a cardiac catheterization performed by myself 08/27/13. The  right radial approach revealing essentially normal coronary arteries and normal LV function. This was done after a false-positive Myoview. He was seen recently by Shawn Vaughan who did an echo that apparently showed severe LV dysfunction with an abnormal Myoview as well and was referred here for repeat cardiac catheterization. He denies chest pain or shortness of breath.  I have reviewed the risks, indications, and alternatives to cardiac catheterization, possible angioplasty, and stenting with the patient. Risks include but are not limited to bleeding, infection, vascular injury, stroke, myocardial infection, arrhythmia, kidney injury, radiation-related injury in the case of prolonged fluoroscopy use, emergency cardiac surgery, and death. The patient understands the risks of serious complication is 1-2 in 1000 with diagnostic cardiac cath and 1-2% or less with angioplasty/stenting.        Vaughan.  MD Shawn Vaughan,FACC,Shawn Vaughan, Shawn Vaughan 02/10/2016 11:51 AM  

## 2016-02-23 ENCOUNTER — Encounter (HOSPITAL_COMMUNITY): Payer: Self-pay | Admitting: Cardiovascular Disease

## 2016-02-24 MED FILL — Heparin Sodium (Porcine) 2 Unit/ML in Sodium Chloride 0.9%: INTRAMUSCULAR | Qty: 500 | Status: AC

## 2017-06-22 IMAGING — CR DG CHEST 2V
3 series · 3 of 3 positions shown · non-contrast
Comparison: None in PACs

CLINICAL DATA: Preoperative exam prior to heart catheterization ;
no current chest complaints, history of diabetes and hypertension
and hyperlipidemia.

EXAM:
CHEST  2 VIEW

[w chest pa]
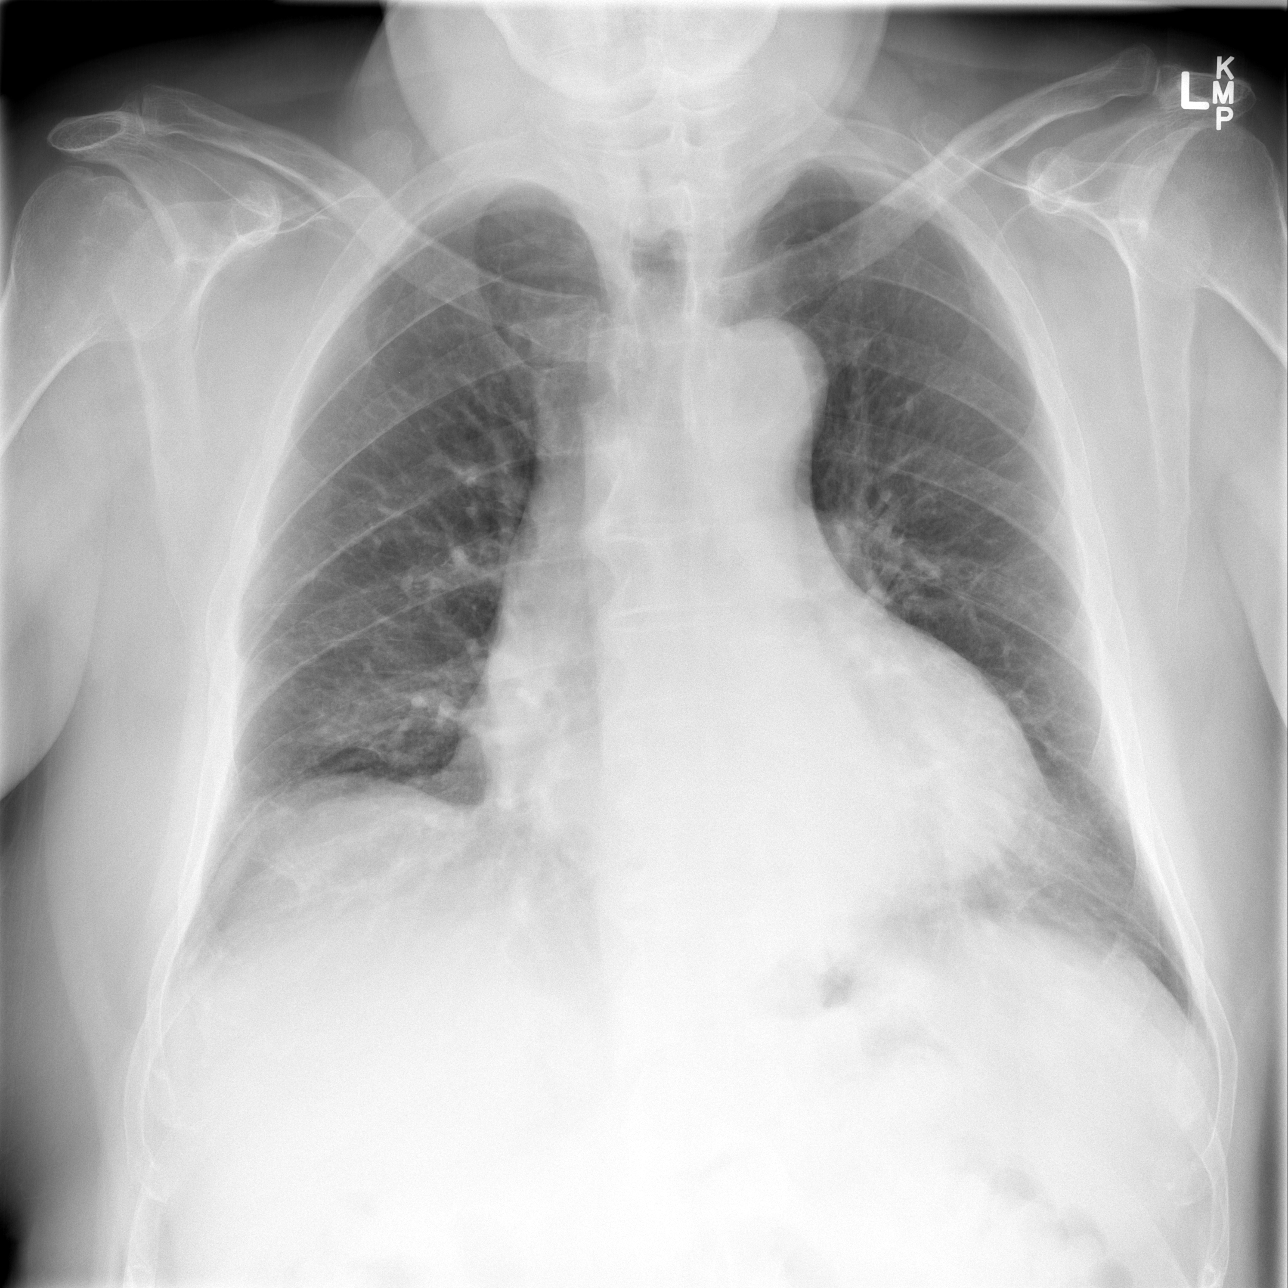

[w chest lat (1 of 2)]
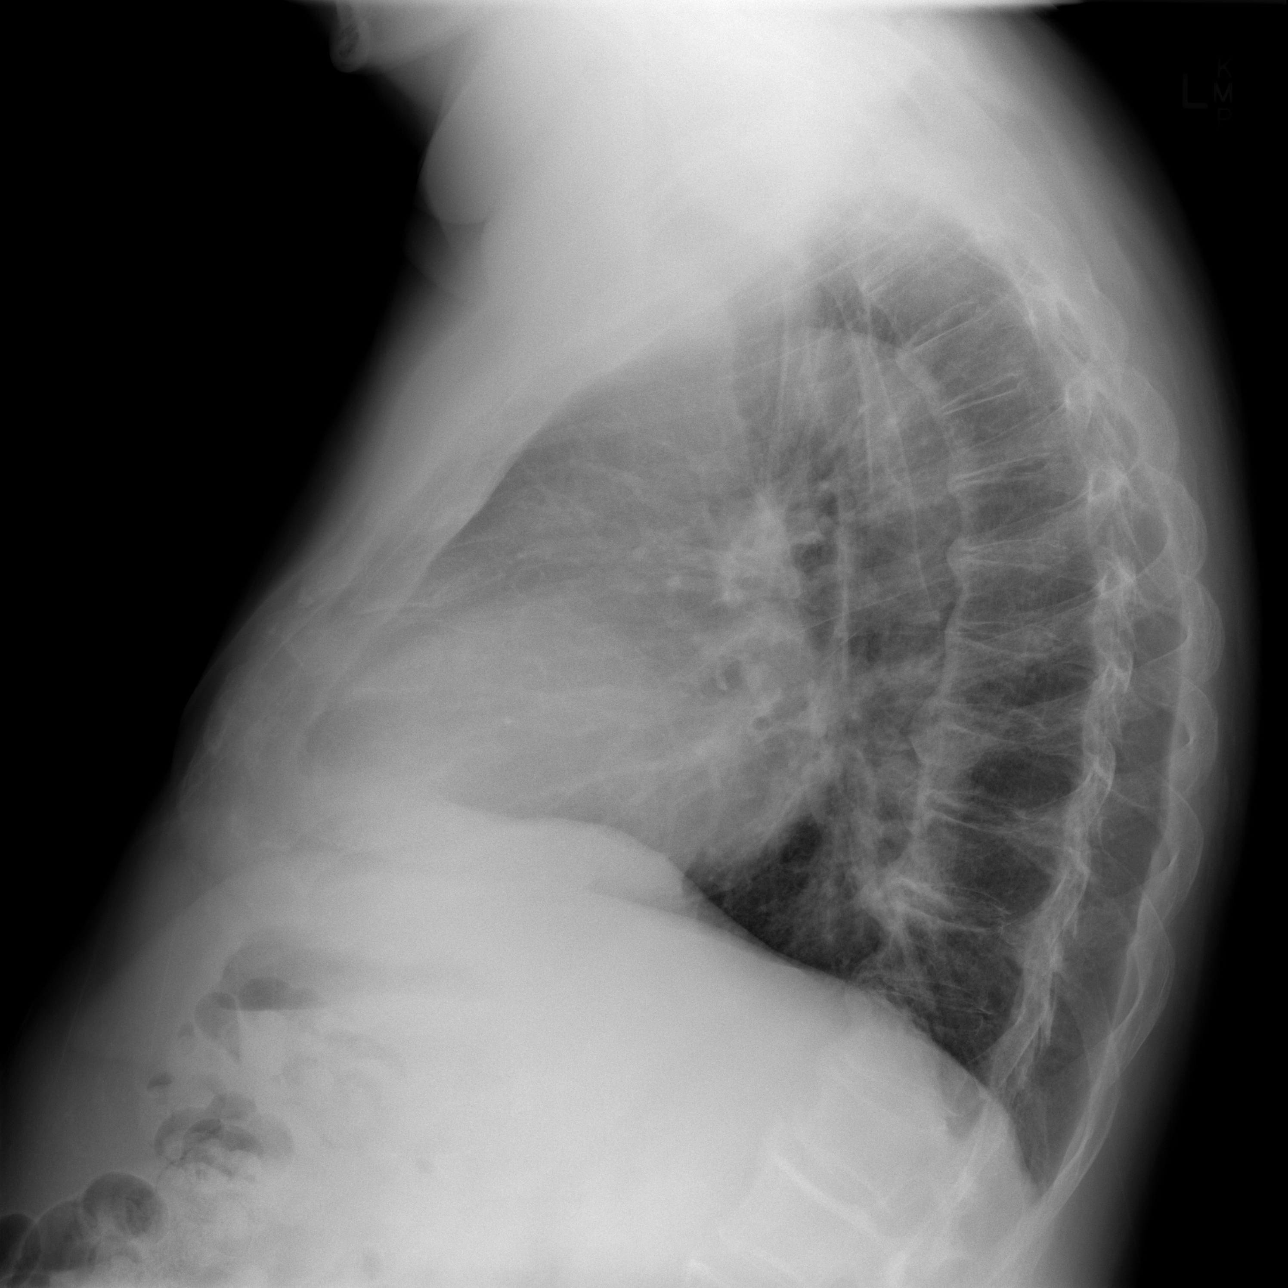

[w chest lat (2 of 2)]
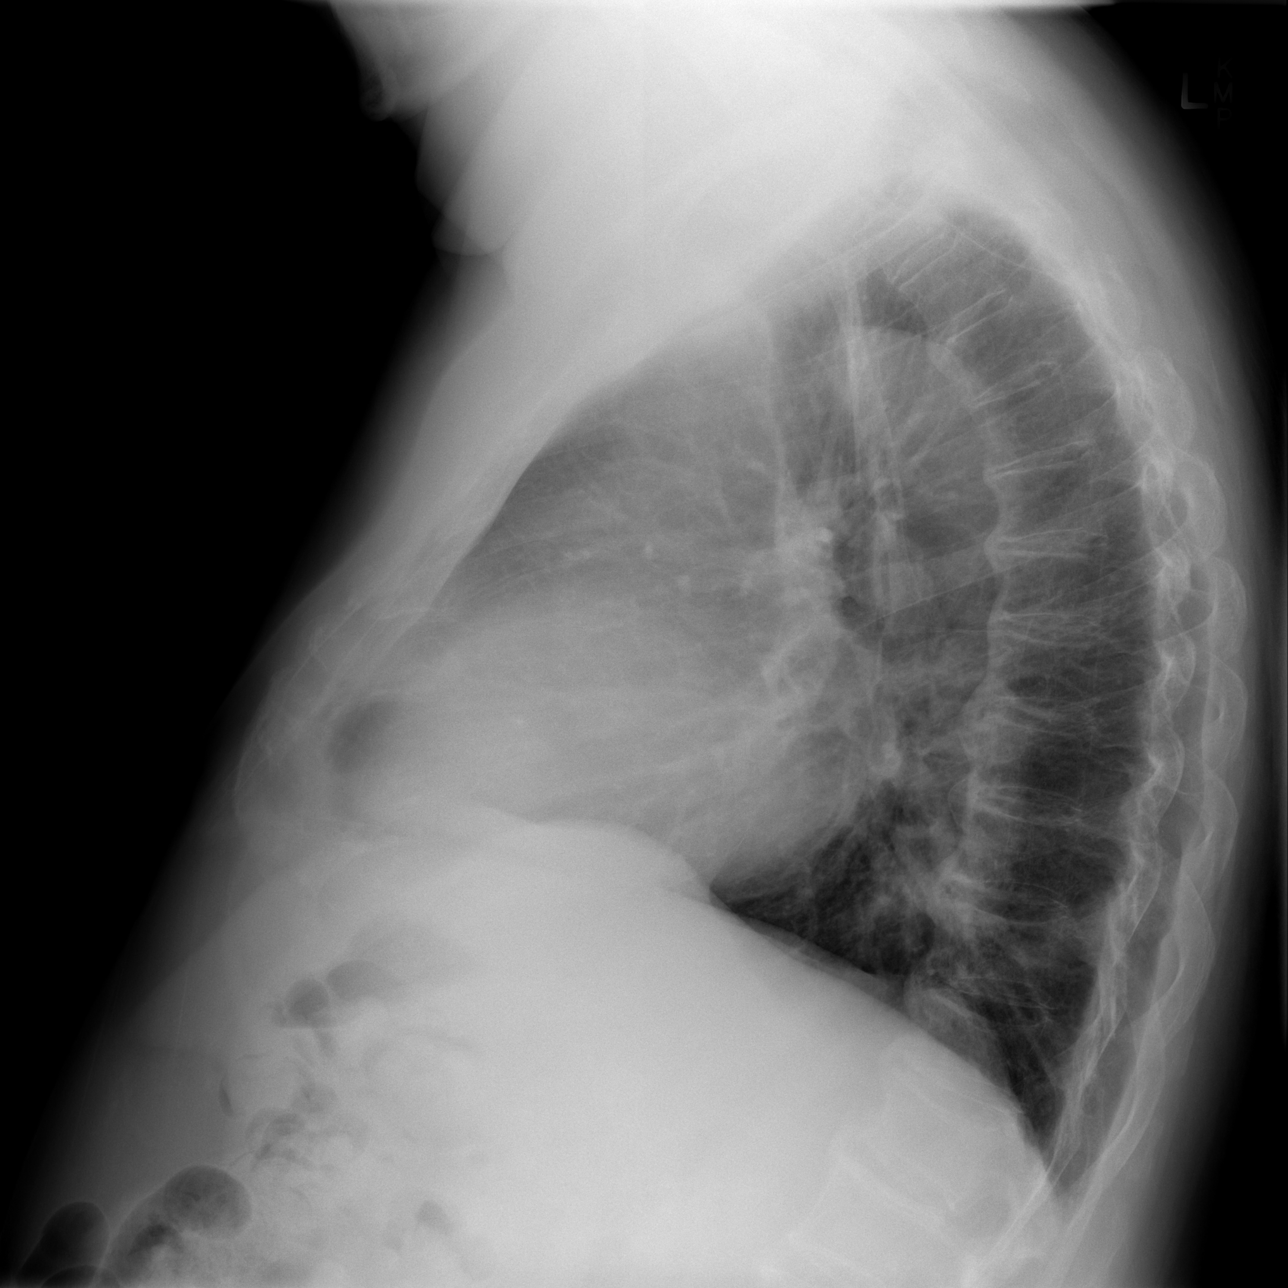

[3 of 3 positions shown; findings below may reference images not displayed]

FINDINGS: The lungs are adequately inflated. There is no focal infiltrate.
There is no pleural effusion. The cardiac silhouette is mildly
enlarged. The pulmonary vascularity is normal. There is tortuosity
of the descending thoracic aorta. There is multilevel degenerative
disc disease of the thoracic spine with calcification of portions of
the anterior longitudinal ligament.
IMPRESSION: Mild enlargement of the cardiac silhouette without pulmonary
vascular congestion. No evidence of pneumonia nor other acute
cardiopulmonary abnormality.

## 2019-10-14 DIAGNOSIS — E7800 Pure hypercholesterolemia, unspecified: Secondary | ICD-10-CM | POA: Diagnosis not present

## 2019-10-14 DIAGNOSIS — I10 Essential (primary) hypertension: Secondary | ICD-10-CM | POA: Diagnosis not present

## 2019-10-14 DIAGNOSIS — E1165 Type 2 diabetes mellitus with hyperglycemia: Secondary | ICD-10-CM | POA: Diagnosis not present

## 2019-10-14 DIAGNOSIS — Z79899 Other long term (current) drug therapy: Secondary | ICD-10-CM | POA: Diagnosis not present

## 2019-10-28 DIAGNOSIS — I10 Essential (primary) hypertension: Secondary | ICD-10-CM | POA: Diagnosis not present

## 2019-10-28 DIAGNOSIS — Z789 Other specified health status: Secondary | ICD-10-CM | POA: Diagnosis not present

## 2019-10-28 DIAGNOSIS — E1165 Type 2 diabetes mellitus with hyperglycemia: Secondary | ICD-10-CM | POA: Diagnosis not present

## 2019-10-30 DIAGNOSIS — I10 Essential (primary) hypertension: Secondary | ICD-10-CM | POA: Diagnosis not present

## 2019-10-30 DIAGNOSIS — E1165 Type 2 diabetes mellitus with hyperglycemia: Secondary | ICD-10-CM | POA: Diagnosis not present

## 2019-10-30 DIAGNOSIS — E7800 Pure hypercholesterolemia, unspecified: Secondary | ICD-10-CM | POA: Diagnosis not present

## 2019-11-15 DIAGNOSIS — Z789 Other specified health status: Secondary | ICD-10-CM | POA: Diagnosis not present

## 2019-11-15 DIAGNOSIS — E7800 Pure hypercholesterolemia, unspecified: Secondary | ICD-10-CM | POA: Diagnosis not present

## 2019-11-15 DIAGNOSIS — I10 Essential (primary) hypertension: Secondary | ICD-10-CM | POA: Diagnosis not present

## 2020-01-01 DIAGNOSIS — E1165 Type 2 diabetes mellitus with hyperglycemia: Secondary | ICD-10-CM | POA: Diagnosis not present

## 2020-01-01 DIAGNOSIS — Z789 Other specified health status: Secondary | ICD-10-CM | POA: Diagnosis not present

## 2020-01-01 DIAGNOSIS — E7800 Pure hypercholesterolemia, unspecified: Secondary | ICD-10-CM | POA: Diagnosis not present

## 2020-01-27 DIAGNOSIS — I10 Essential (primary) hypertension: Secondary | ICD-10-CM | POA: Diagnosis not present

## 2020-01-27 DIAGNOSIS — E1165 Type 2 diabetes mellitus with hyperglycemia: Secondary | ICD-10-CM | POA: Diagnosis not present

## 2020-01-27 DIAGNOSIS — Z79899 Other long term (current) drug therapy: Secondary | ICD-10-CM | POA: Diagnosis not present

## 2020-01-27 DIAGNOSIS — E7800 Pure hypercholesterolemia, unspecified: Secondary | ICD-10-CM | POA: Diagnosis not present

## 2020-02-14 DIAGNOSIS — R339 Retention of urine, unspecified: Secondary | ICD-10-CM | POA: Diagnosis not present

## 2020-02-14 DIAGNOSIS — N401 Enlarged prostate with lower urinary tract symptoms: Secondary | ICD-10-CM | POA: Diagnosis not present

## 2020-02-14 DIAGNOSIS — N138 Other obstructive and reflux uropathy: Secondary | ICD-10-CM | POA: Diagnosis not present

## 2020-02-14 DIAGNOSIS — R358 Other polyuria: Secondary | ICD-10-CM | POA: Diagnosis not present

## 2020-02-17 DIAGNOSIS — I517 Cardiomegaly: Secondary | ICD-10-CM | POA: Diagnosis not present

## 2020-02-17 DIAGNOSIS — I509 Heart failure, unspecified: Secondary | ICD-10-CM | POA: Diagnosis not present

## 2020-02-17 DIAGNOSIS — I10 Essential (primary) hypertension: Secondary | ICD-10-CM | POA: Diagnosis not present

## 2020-02-17 DIAGNOSIS — I351 Nonrheumatic aortic (valve) insufficiency: Secondary | ICD-10-CM | POA: Diagnosis not present

## 2020-02-17 DIAGNOSIS — E7800 Pure hypercholesterolemia, unspecified: Secondary | ICD-10-CM | POA: Diagnosis not present

## 2020-02-17 DIAGNOSIS — E119 Type 2 diabetes mellitus without complications: Secondary | ICD-10-CM | POA: Diagnosis not present

## 2020-02-17 DIAGNOSIS — I4891 Unspecified atrial fibrillation: Secondary | ICD-10-CM | POA: Diagnosis not present

## 2020-02-18 DIAGNOSIS — Z789 Other specified health status: Secondary | ICD-10-CM | POA: Diagnosis not present

## 2020-02-18 DIAGNOSIS — E7800 Pure hypercholesterolemia, unspecified: Secondary | ICD-10-CM | POA: Diagnosis not present

## 2020-02-18 DIAGNOSIS — E119 Type 2 diabetes mellitus without complications: Secondary | ICD-10-CM | POA: Diagnosis not present

## 2020-02-20 DIAGNOSIS — N401 Enlarged prostate with lower urinary tract symptoms: Secondary | ICD-10-CM | POA: Diagnosis not present

## 2020-02-20 DIAGNOSIS — N138 Other obstructive and reflux uropathy: Secondary | ICD-10-CM | POA: Diagnosis not present

## 2020-03-03 DIAGNOSIS — N401 Enlarged prostate with lower urinary tract symptoms: Secondary | ICD-10-CM | POA: Diagnosis not present

## 2020-03-03 DIAGNOSIS — N138 Other obstructive and reflux uropathy: Secondary | ICD-10-CM | POA: Diagnosis not present

## 2020-03-18 DIAGNOSIS — N320 Bladder-neck obstruction: Secondary | ICD-10-CM | POA: Diagnosis not present

## 2020-03-18 DIAGNOSIS — E119 Type 2 diabetes mellitus without complications: Secondary | ICD-10-CM | POA: Diagnosis not present

## 2020-03-18 DIAGNOSIS — N020 Recurrent and persistent hematuria with minor glomerular abnormality: Secondary | ICD-10-CM | POA: Diagnosis not present

## 2020-03-18 DIAGNOSIS — N401 Enlarged prostate with lower urinary tract symptoms: Secondary | ICD-10-CM | POA: Diagnosis not present

## 2020-03-18 DIAGNOSIS — N138 Other obstructive and reflux uropathy: Secondary | ICD-10-CM | POA: Diagnosis not present

## 2020-03-24 DIAGNOSIS — N401 Enlarged prostate with lower urinary tract symptoms: Secondary | ICD-10-CM | POA: Diagnosis not present

## 2020-03-24 DIAGNOSIS — N138 Other obstructive and reflux uropathy: Secondary | ICD-10-CM | POA: Diagnosis not present

## 2020-03-25 DIAGNOSIS — I10 Essential (primary) hypertension: Secondary | ICD-10-CM | POA: Diagnosis not present

## 2020-03-25 DIAGNOSIS — Z789 Other specified health status: Secondary | ICD-10-CM | POA: Diagnosis not present

## 2020-03-25 DIAGNOSIS — E7800 Pure hypercholesterolemia, unspecified: Secondary | ICD-10-CM | POA: Diagnosis not present

## 2020-03-27 DIAGNOSIS — E1165 Type 2 diabetes mellitus with hyperglycemia: Secondary | ICD-10-CM | POA: Diagnosis not present

## 2020-03-27 DIAGNOSIS — I739 Peripheral vascular disease, unspecified: Secondary | ICD-10-CM | POA: Diagnosis not present

## 2020-03-27 DIAGNOSIS — I779 Disorder of arteries and arterioles, unspecified: Secondary | ICD-10-CM | POA: Diagnosis not present

## 2020-03-27 DIAGNOSIS — I10 Essential (primary) hypertension: Secondary | ICD-10-CM | POA: Diagnosis not present

## 2020-04-14 DIAGNOSIS — N401 Enlarged prostate with lower urinary tract symptoms: Secondary | ICD-10-CM | POA: Diagnosis not present

## 2020-04-14 DIAGNOSIS — N138 Other obstructive and reflux uropathy: Secondary | ICD-10-CM | POA: Diagnosis not present

## 2020-04-30 DIAGNOSIS — I10 Essential (primary) hypertension: Secondary | ICD-10-CM | POA: Diagnosis not present

## 2020-04-30 DIAGNOSIS — Z789 Other specified health status: Secondary | ICD-10-CM | POA: Diagnosis not present

## 2020-04-30 DIAGNOSIS — E7800 Pure hypercholesterolemia, unspecified: Secondary | ICD-10-CM | POA: Diagnosis not present

## 2020-05-22 DIAGNOSIS — H35033 Hypertensive retinopathy, bilateral: Secondary | ICD-10-CM | POA: Diagnosis not present

## 2020-05-22 DIAGNOSIS — H0262 Xanthelasma of right lower eyelid: Secondary | ICD-10-CM | POA: Diagnosis not present

## 2020-05-22 DIAGNOSIS — H0261 Xanthelasma of right upper eyelid: Secondary | ICD-10-CM | POA: Diagnosis not present

## 2020-05-22 DIAGNOSIS — H353111 Nonexudative age-related macular degeneration, right eye, early dry stage: Secondary | ICD-10-CM | POA: Diagnosis not present

## 2020-05-22 DIAGNOSIS — H2513 Age-related nuclear cataract, bilateral: Secondary | ICD-10-CM | POA: Diagnosis not present

## 2020-05-22 DIAGNOSIS — E119 Type 2 diabetes mellitus without complications: Secondary | ICD-10-CM | POA: Diagnosis not present

## 2020-06-23 DIAGNOSIS — E7800 Pure hypercholesterolemia, unspecified: Secondary | ICD-10-CM | POA: Diagnosis not present

## 2020-06-23 DIAGNOSIS — I10 Essential (primary) hypertension: Secondary | ICD-10-CM | POA: Diagnosis not present

## 2020-06-23 DIAGNOSIS — Z789 Other specified health status: Secondary | ICD-10-CM | POA: Diagnosis not present

## 2020-07-03 DIAGNOSIS — R310 Gross hematuria: Secondary | ICD-10-CM | POA: Diagnosis not present

## 2020-07-03 DIAGNOSIS — Z79899 Other long term (current) drug therapy: Secondary | ICD-10-CM | POA: Diagnosis not present

## 2020-07-03 DIAGNOSIS — E1165 Type 2 diabetes mellitus with hyperglycemia: Secondary | ICD-10-CM | POA: Diagnosis not present

## 2020-07-03 DIAGNOSIS — Z0000 Encounter for general adult medical examination without abnormal findings: Secondary | ICD-10-CM | POA: Diagnosis not present

## 2020-07-03 DIAGNOSIS — E7800 Pure hypercholesterolemia, unspecified: Secondary | ICD-10-CM | POA: Diagnosis not present

## 2020-07-03 DIAGNOSIS — I10 Essential (primary) hypertension: Secondary | ICD-10-CM | POA: Diagnosis not present

## 2020-07-06 DIAGNOSIS — R310 Gross hematuria: Secondary | ICD-10-CM | POA: Diagnosis not present

## 2020-07-06 DIAGNOSIS — R339 Retention of urine, unspecified: Secondary | ICD-10-CM | POA: Diagnosis not present

## 2020-07-17 DIAGNOSIS — E7800 Pure hypercholesterolemia, unspecified: Secondary | ICD-10-CM | POA: Diagnosis not present

## 2020-07-17 DIAGNOSIS — E1165 Type 2 diabetes mellitus with hyperglycemia: Secondary | ICD-10-CM | POA: Diagnosis not present

## 2020-07-17 DIAGNOSIS — I509 Heart failure, unspecified: Secondary | ICD-10-CM | POA: Diagnosis not present

## 2020-07-17 DIAGNOSIS — I10 Essential (primary) hypertension: Secondary | ICD-10-CM | POA: Diagnosis not present

## 2020-07-21 DIAGNOSIS — R310 Gross hematuria: Secondary | ICD-10-CM | POA: Diagnosis not present

## 2020-07-21 DIAGNOSIS — R3589 Other polyuria: Secondary | ICD-10-CM | POA: Diagnosis not present

## 2020-07-27 DIAGNOSIS — Z789 Other specified health status: Secondary | ICD-10-CM | POA: Diagnosis not present

## 2020-07-27 DIAGNOSIS — E785 Hyperlipidemia, unspecified: Secondary | ICD-10-CM | POA: Diagnosis not present

## 2020-07-27 DIAGNOSIS — E119 Type 2 diabetes mellitus without complications: Secondary | ICD-10-CM | POA: Diagnosis not present

## 2020-08-03 DIAGNOSIS — N281 Cyst of kidney, acquired: Secondary | ICD-10-CM | POA: Diagnosis not present

## 2020-08-03 DIAGNOSIS — N3289 Other specified disorders of bladder: Secondary | ICD-10-CM | POA: Diagnosis not present

## 2020-08-03 DIAGNOSIS — R310 Gross hematuria: Secondary | ICD-10-CM | POA: Diagnosis not present

## 2020-08-03 DIAGNOSIS — I878 Other specified disorders of veins: Secondary | ICD-10-CM | POA: Diagnosis not present

## 2020-08-03 DIAGNOSIS — N2889 Other specified disorders of kidney and ureter: Secondary | ICD-10-CM | POA: Diagnosis not present

## 2020-08-19 DIAGNOSIS — Z789 Other specified health status: Secondary | ICD-10-CM | POA: Diagnosis not present

## 2020-08-19 DIAGNOSIS — F32A Depression, unspecified: Secondary | ICD-10-CM | POA: Diagnosis not present

## 2020-08-19 DIAGNOSIS — F419 Anxiety disorder, unspecified: Secondary | ICD-10-CM | POA: Diagnosis not present

## 2020-08-19 DIAGNOSIS — E785 Hyperlipidemia, unspecified: Secondary | ICD-10-CM | POA: Diagnosis not present
